# Patient Record
Sex: Male | Born: 1960 | Race: Black or African American | Hispanic: No | Marital: Single
Health system: Southern US, Community
[De-identification: ages and names within clinical notes are randomized; demographics above are authoritative.]

---

## 2007-06-11 ENCOUNTER — Ambulatory Visit (HOSPITAL_COMMUNITY): Payer: Self-pay | Admitting: Psychiatry

## 2007-06-21 ENCOUNTER — Ambulatory Visit (HOSPITAL_COMMUNITY): Payer: Self-pay | Admitting: Psychiatry

## 2018-01-01 DEATH — deceased

## 2019-08-03 ENCOUNTER — Encounter (HOSPITAL_COMMUNITY): Payer: Self-pay

## 2019-08-03 ENCOUNTER — Other Ambulatory Visit: Payer: Self-pay

## 2019-08-03 ENCOUNTER — Emergency Department (HOSPITAL_COMMUNITY): Payer: BLUE CROSS/BLUE SHIELD

## 2019-08-03 ENCOUNTER — Emergency Department (HOSPITAL_COMMUNITY)
Admission: EM | Admit: 2019-08-03 | Discharge: 2019-08-04 | Disposition: A | Discharge status: Home / Self Care | Payer: BLUE CROSS/BLUE SHIELD | Attending: Emergency Medicine | Admitting: Emergency Medicine

## 2019-08-03 DIAGNOSIS — R112 Nausea with vomiting, unspecified: Secondary | ICD-10-CM

## 2019-08-03 DIAGNOSIS — U071 COVID-19: Secondary | ICD-10-CM | POA: Diagnosis not present

## 2019-08-03 DIAGNOSIS — R5381 Other malaise: Secondary | ICD-10-CM

## 2019-08-03 MED ORDER — SODIUM CHLORIDE 0.9% FLUSH
3.0000 mL | Freq: Once | INTRAVENOUS | Status: AC
  Administered 2019-08-04: 3 mL via INTRAVENOUS

## 2019-08-03 MED ORDER — SODIUM CHLORIDE 0.9 % IV BOLUS
500.0000 mL | Freq: Once | INTRAVENOUS | Status: AC
  Administered 2019-08-04: 500 mL via INTRAVENOUS

## 2019-08-03 MED ORDER — ONDANSETRON HCL 4 MG/2ML IJ SOLN
4.0000 mg | Freq: Once | INTRAMUSCULAR | Status: AC
  Administered 2019-08-04: 4 mg via INTRAVENOUS
  Filled 2019-08-03: qty 2

## 2019-08-03 NOTE — ED Triage Notes (Signed)
Per EMS, patient c/o nausea/vomitting starting Monday 1/25. Patient states he tested positive for covid-19 on 1/26. Patient is taking doxycycline for covid and states he took Tylenol around 1300 today. Patient from home, is ambulatory and a&ox4.

## 2019-08-04 LAB — COMPREHENSIVE METABOLIC PANEL
ALT: 25 U/L (ref 0–44)
AST: 36 U/L (ref 15–41)
Albumin: 3.8 g/dL (ref 3.5–5.0)
Alkaline Phosphatase: 43 U/L (ref 38–126)
Anion gap: 9 (ref 5–15)
BUN: 21 mg/dL — ABNORMAL HIGH (ref 6–20)
CO2: 24 mmol/L (ref 22–32)
Calcium: 7.7 mg/dL — ABNORMAL LOW (ref 8.9–10.3)
Chloride: 104 mmol/L (ref 98–111)
Creatinine, Ser: 1.05 mg/dL (ref 0.61–1.24)
GFR calc Af Amer: >60 mL/min (ref >60)
GFR calc non Af Amer: >60 mL/min (ref >60)
Glucose, Bld: 93 mg/dL (ref 70–99)
Potassium: 3.9 mmol/L (ref 3.5–5.1)
Sodium: 137 mmol/L (ref 135–145)
Total Bilirubin: 1.4 mg/dL — ABNORMAL HIGH (ref 0.3–1.2)
Total Protein: 7.2 g/dL (ref 6.5–8.1)

## 2019-08-04 LAB — CBC
HCT: 48.7 % (ref 39.0–52.0)
Hemoglobin: 16.3 g/dL (ref 13.0–17.0)
MCH: 31.2 pg (ref 26.0–34.0)
MCHC: 33.5 g/dL (ref 30.0–36.0)
MCV: 93.3 fL (ref 80.0–100.0)
Platelets: 149 10*3/uL — ABNORMAL LOW (ref 150–400)
RBC: 5.22 MIL/uL (ref 4.22–5.81)
RDW: 12.8 % (ref 11.5–15.5)
WBC: 3.6 10*3/uL — ABNORMAL LOW (ref 4.0–10.5)
nRBC: 0 % (ref 0.0–0.2)

## 2019-08-04 LAB — LIPASE, BLOOD: Lipase: 45 U/L (ref 11–51)

## 2019-08-04 MED ORDER — ONDANSETRON 8 MG PO TBDP: 8.0000 mg | ORAL_TABLET | Freq: Three times a day (TID) | ORAL | 0 refills | Status: AC | PRN

## 2019-08-04 NOTE — ED Notes (Signed)
Patient tolerated fluid/po challenge well.

## 2019-08-04 NOTE — ED Notes (Signed)
Patient attempted to provide urine sample but stated he is unable to do so at this time. Urinal at bedside. This RN provided patient with crackers and water for PO/fluid challenge.

## 2019-08-04 NOTE — ED Provider Notes (Addendum)
[facility]-EMERGENCY DEPT Provider Note   CSN: [id number] Arrival date & time: 08/03/19  2149     History No chief complaint on file.   Shannon Bullock is a 59 y.o. male.  HPI     59 year old healthy male comes in a chief complaint of nausea, vomiting, fatigue and malaise.  He was diagnosed with COVID-19 4 days ago.  He has been symptomatic for the last 5 days.  He had a cough that has subsided.  He continues to have shortness of breath with exertion and malaise but lately has been having worsening nausea and vomiting.  He has not been able to tolerate much food and has no appetite.  He is also feeling weaker having some headaches.  Patient has no underlying lung disease, cardiac disease.  History reviewed. No pertinent past medical history.  There are no problems to display for this patient.   History reviewed. No pertinent surgical history.     No family history on file.  Social History   Tobacco Use  . Smoking status: Not on file  Substance Use Topics  . Alcohol use: Not on file  . Drug use: Not on file    Home Medications Prior to Admission medications   Medication Sig Start Date End Date Taking? Authorizing Provider  albuterol (VENTOLIN HFA) 108 (90 Base) MCG/ACT inhaler Inhale 1 puff into the lungs every 6 (six) hours as needed for wheezing or shortness of breath.  07/30/19 08/29/19 Yes [provider]  doxycycline (VIBRAMYCIN) 100 MG capsule Take 100 mg by mouth 2 (two) times daily. 07/30/19 08/09/19 Yes [provider]  ondansetron (ZOFRAN ODT) 8 MG disintegrating tablet Take 1 tablet (8 mg total) by mouth every 8 (eight) hours as needed for nausea. 08/04/19   Derwood Kaplan, MD    Allergies    Patient has no known allergies.  Review of Systems   Review of Systems  Constitutional: Positive for activity change.  Respiratory: Positive for shortness of breath. Negative for cough.   Cardiovascular: Negative for chest pain.   Gastrointestinal: Positive for nausea and vomiting. Negative for abdominal pain.  Allergic/Immunologic: Negative for immunocompromised state.  Hematological: Does not bruise/bleed easily.  All other systems reviewed and are negative.   Physical Exam Updated Vital Signs BP 125/89   Pulse 98   Temp 99.9 F (37.7 C) (Oral)   Resp (!) 21   SpO2 95%   Physical Exam Vitals and nursing note reviewed.  Constitutional:      Appearance: He is well-developed.  HENT:     Head: Atraumatic.     Mouth/Throat:     Mouth: Mucous membranes are dry.  Cardiovascular:     Rate and Rhythm: Normal rate.  Pulmonary:     Effort: Pulmonary effort is normal.  Abdominal:     Tenderness: There is no abdominal tenderness.  Musculoskeletal:     Cervical back: Neck supple.  Skin:    General: Skin is warm.  Neurological:     Mental Status: He is alert and oriented to person, place, and time.     ED Results / Procedures / Treatments   Labs (all labs ordered are listed, but only abnormal results are displayed) Labs Reviewed  COMPREHENSIVE METABOLIC PANEL - Abnormal; Notable for the following components:      Result Value   BUN 21 (*)    Calcium 7.8 (*)    Total Bilirubin 1.5 (*)    All other components within normal limits  CBC - Abnormal; Notable for the following components:   WBC 3.6 (*)    Platelets 149 (*)    All other components within normal limits  LIPASE, BLOOD  URINALYSIS, ROUTINE W REFLEX MICROSCOPIC    EKG None  Radiology DG Chest Port 1 View  Result Date: 08/04/2019 CLINICAL DATA:  Nausea and vomiting. EXAM: PORTABLE CHEST 1 VIEW COMPARISON:  None. FINDINGS: Very mild atelectasis and/or early infiltrate is seen within the left lung base. There is no evidence of a pleural effusion or pneumothorax. The heart size and mediastinal contours are within normal limits. Multilevel degenerative changes seen throughout the thoracic spine. IMPRESSION: 1. Very mild left basilar  atelectasis and/or early infiltrate. Electronically Signed   By: Virgina Norfolk M.D.   On: 08/04/2019 00:00    Procedures Procedures (including critical care time)  Medications Ordered in ED Medications  sodium chloride flush (NS) 0.9 % injection 3 mL (3 mLs Intravenous Given 08/04/19 0042)  ondansetron (ZOFRAN) injection 4 mg (4 mg Intravenous Given 08/04/19 0029)  sodium chloride 0.9 % bolus 500 mL (0 mLs Intravenous Stopped 08/04/19 0217)    ED Course  I have reviewed the triage vital signs and the nursing notes.  Pertinent labs & imaging results that were available during my care of the patient were reviewed by me and considered in my medical decision making (see chart for details).  Clinical Course as of Aug 03 224  Nancy Fetter Aug 04, 2019  0226 Labs are reassuring. Pt reassessed. Pt's VSS and WNL. Pt's cap refill < 3 seconds. Pt has been hydrated in the ER and now passed po challenge. We will discharge with antiemetic. Strict ER return precautions have been discussed and pt will return if he is unable to tolerate fluids and symptoms are getting worse.    [AN]    Clinical Course User Index [AN] Varney Biles, MD   MDM Rules/Calculators/A&P                      Shannon Bullock was evaluated in Emergency Department on 08/04/2019 for the symptoms described in the history of present illness. He was evaluated in the context of the global COVID-19 pandemic, which necessitated consideration that the patient might be at risk for infection with the SARS-CoV-2 virus that causes COVID-19. Institutional protocols and algorithms that pertain to the evaluation of patients at risk for COVID-19 are in a state of rapid change based on information released by regulatory bodies including the CDC and federal and state organizations. These policies and algorithms were followed during the patient's care in the ED.   59 year old healthy male comes in a chief complaint of nausea, vomiting and worsening  weakness.  He has been diagnosed with COVID-19.  He does not have any primary respiratory issues.  On exam he is appearing dry. He is not hypoxic and he is hemodynamically stable.  I suspect he will be discharged to the hospital we will ensure that there is no renal failure or significant electrolyte abnormalities.  We will start oral challenge.  Final Clinical Impression(s) / ED Diagnoses Final diagnoses:  COVID-19  Non-intractable vomiting with nausea, unspecified vomiting type  Malaise and fatigue    Rx / DC Orders ED Discharge Orders         Ordered    ondansetron (ZOFRAN ODT) 8 MG disintegrating tablet  Every 8 hours PRN     08/04/19 0225           Mikaylee Arseneau,  Delisia Mcquiston, MD 08/04/19 5465    Derwood Kaplan, MD 08/04/19 0354

## 2019-08-04 NOTE — Discharge Instructions (Signed)
Fortunately, your blood work is reassuring.  We did not see any signs of organ damage, and your electrolytes are normal.  Take the nausea medicine as prescribed. Ensure you hydrate well.

## 2020-02-25 ENCOUNTER — Other Ambulatory Visit: Payer: Self-pay

## 2020-02-25 ENCOUNTER — Emergency Department (HOSPITAL_COMMUNITY)
Admission: EM | Admit: 2020-02-25 | Discharge: 2020-02-25 | Disposition: A | Discharge status: Home / Self Care | Payer: BC Managed Care – PPO | Attending: Emergency Medicine | Admitting: Emergency Medicine

## 2020-02-25 ENCOUNTER — Encounter (HOSPITAL_COMMUNITY): Payer: Self-pay

## 2020-02-25 DIAGNOSIS — R519 Headache, unspecified: Secondary | ICD-10-CM | POA: Insufficient documentation

## 2020-02-25 DIAGNOSIS — N12 Tubulo-interstitial nephritis, not specified as acute or chronic: Secondary | ICD-10-CM | POA: Diagnosis not present

## 2020-02-25 DIAGNOSIS — R509 Fever, unspecified: Secondary | ICD-10-CM | POA: Diagnosis present

## 2020-02-25 DIAGNOSIS — Z20822 Contact with and (suspected) exposure to covid-19: Secondary | ICD-10-CM | POA: Insufficient documentation

## 2020-02-25 DIAGNOSIS — R42 Dizziness and giddiness: Secondary | ICD-10-CM | POA: Diagnosis not present

## 2020-02-25 LAB — BASIC METABOLIC PANEL
Anion gap: 10 (ref 5–15)
BUN: 12 mg/dL (ref 6–20)
CO2: 22 mmol/L (ref 22–32)
Calcium: 8.8 mg/dL — ABNORMAL LOW (ref 8.9–10.3)
Chloride: 102 mmol/L (ref 98–111)
Creatinine, Ser: 1 mg/dL (ref 0.61–1.24)
GFR calc Af Amer: >60 mL/min (ref >60)
GFR calc non Af Amer: >60 mL/min (ref >60)
Glucose, Bld: 107 mg/dL — ABNORMAL HIGH (ref 70–99)
Potassium: 3.4 mmol/L — ABNORMAL LOW (ref 3.5–5.1)
Sodium: 134 mmol/L — ABNORMAL LOW (ref 135–145)

## 2020-02-25 LAB — CBC WITH DIFFERENTIAL/PLATELET
Abs Immature Granulocytes: 0.06 10*3/uL (ref 0.00–0.07)
Basophils Absolute: 0 10*3/uL (ref 0.0–0.1)
Basophils Relative: 0 %
Eosinophils Absolute: 0 10*3/uL (ref 0.0–0.5)
Eosinophils Relative: 0 %
HCT: 48.2 % (ref 39.0–52.0)
Hemoglobin: 16.4 g/dL (ref 13.0–17.0)
Immature Granulocytes: 0 %
Lymphocytes Relative: 11 %
Lymphs Abs: 1.7 10*3/uL (ref 0.7–4.0)
MCH: 31.7 pg (ref 26.0–34.0)
MCHC: 34 g/dL (ref 30.0–36.0)
MCV: 93.2 fL (ref 80.0–100.0)
Monocytes Absolute: 1.1 10*3/uL — ABNORMAL HIGH (ref 0.1–1.0)
Monocytes Relative: 7 %
Neutro Abs: 12.2 10*3/uL — ABNORMAL HIGH (ref 1.7–7.7)
Neutrophils Relative %: 82 %
Platelets: 176 10*3/uL (ref 150–400)
RBC: 5.17 MIL/uL (ref 4.22–5.81)
RDW: 13.2 % (ref 11.5–15.5)
WBC: 15 10*3/uL — ABNORMAL HIGH (ref 4.0–10.5)
nRBC: 0 % (ref 0.0–0.2)

## 2020-02-25 LAB — URINALYSIS, ROUTINE W REFLEX MICROSCOPIC
Bilirubin Urine: NEGATIVE
Glucose, UA: NEGATIVE mg/dL
Ketones, ur: 20 mg/dL — AB
Nitrite: NEGATIVE
Protein, ur: 30 mg/dL — AB
Specific Gravity, Urine: 1.024 (ref 1.005–1.030)
WBC, UA: >50 WBC/hpf — ABNORMAL HIGH (ref 0–5)
pH: 5 (ref 5.0–8.0)

## 2020-02-25 LAB — LACTIC ACID, PLASMA: Lactic Acid, Venous: 1.1 mmol/L (ref 0.5–1.9)

## 2020-02-25 LAB — SARS CORONAVIRUS 2 BY RT PCR (HOSPITAL ORDER, PERFORMED IN ~~LOC~~ HOSPITAL LAB): SARS Coronavirus 2: NEGATIVE

## 2020-02-25 MED ORDER — ACETAMINOPHEN 325 MG PO TABS
650.0000 mg | ORAL_TABLET | Freq: Once | ORAL | Status: DC | PRN
  Filled 2020-02-25: qty 2

## 2020-02-25 MED ORDER — CIPROFLOXACIN HCL 500 MG PO TABS: 500.0000 mg | ORAL_TABLET | Freq: Two times a day (BID) | ORAL | 0 refills | Status: AC

## 2020-02-25 MED ORDER — ACETAMINOPHEN 500 MG PO TABS
1000.0000 mg | ORAL_TABLET | Freq: Once | ORAL | Status: AC
  Administered 2020-02-25: 1000 mg via ORAL
  Filled 2020-02-25: qty 2

## 2020-02-25 MED ORDER — SODIUM CHLORIDE 0.9 % IV SOLN
1.0000 g | Freq: Once | INTRAVENOUS | Status: AC
  Administered 2020-02-25: 1 g via INTRAVENOUS
  Filled 2020-02-25: qty 10

## 2020-02-25 NOTE — ED Triage Notes (Addendum)
Pt presents with c/o fever and headache. Pt reports that his symptoms started Sunday night. Pt reports he had Covid earlier this year.

## 2020-02-25 NOTE — Discharge Instructions (Signed)
Please read and follow all provided instructions.  Your diagnoses today include:  1. Pyelonephritis    Tests performed today include:  Blood counts and electrolytes - high infection fighting cells  Kidney function - normal  Urine test - shows sign of infection  Blood and urine culture - pending  Vital signs. See below for your results today.   Medications prescribed:   Ciprofloxacin - antibiotic  You have been prescribed an antibiotic medicine: take the entire course of medicine even if you are feeling better. Stopping early can cause the antibiotic not to work.  Take any prescribed medications only as directed.  Home care instructions:  Follow any educational materials contained in this packet.  BE VERY CAREFUL not to take multiple medicines containing Tylenol (also called acetaminophen). Doing so can lead to an overdose which can damage your liver and cause liver failure and possibly death.   Follow-up instructions: Please follow-up with your primary care provider in the next 3 days for further evaluation of your symptoms.   Return instructions:   Please return to the Emergency Department if you experience worsening symptoms.   Please return with worsening pain, persistent vomiting  Please return if you have any other emergent concerns.  Additional Information:  Your vital signs today were: BP (!) 171/105    Pulse 98    Temp (!) 102.2 F (39 C) (Oral)    Resp 16    Ht 6\' 1"  (1.854 m)    Wt 88.5 kg    SpO2 97%    BMI 25.73 kg/m  If your blood pressure (BP) was elevated above 135/85 this visit, please have this repeated by your doctor within one month. --------------

## 2020-02-25 NOTE — ED Notes (Signed)
Attempted to call pt to give him some Tylenol for his fever. Pt not seen in lobby or surrounding areas. Will check back soon.

## 2020-02-25 NOTE — ED Provider Notes (Signed)
[facility]-EMERGENCY DEPT Provider Note   CSN: [id number] Arrival date & time: 02/25/20  1154     History Chief Complaint  Patient presents with  . Fever  . Headache    Shannon Bullock is a 59 y.o. male.  Patient with history of coronavirus infection in January 2021, vaccine 4/21 -- presents to the ED on day 3 of illness with fever, headache, malaise, lightheadedness, body aches.  Fever to 102.2 F here.  Patient denies nausea, vomiting diarrhea.  He can taste and smell.  He had a close contact recently with a roommate who was just cleared with coronavirus infection.  Patient reports mild burning with urination.  No skin rashes or tick bites.  No neck pain or confusion.        History reviewed. No pertinent past medical history.  There are no problems to display for this patient.   History reviewed. No pertinent surgical history.     History reviewed. No pertinent family history.  Social History   Tobacco Use  . Smoking status: Not on file  Substance Use Topics  . Alcohol use: Not on file  . Drug use: Not on file    Home Medications Prior to Admission medications   Medication Sig Start Date End Date Taking? Authorizing Provider  albuterol (VENTOLIN HFA) 108 (90 Base) MCG/ACT inhaler Inhale 1 puff into the lungs every 6 (six) hours as needed for wheezing or shortness of breath.  07/30/19 08/29/19  [provider]  ondansetron (ZOFRAN ODT) 8 MG disintegrating tablet Take 1 tablet (8 mg total) by mouth every 8 (eight) hours as needed for nausea. 08/04/19   Derwood Kaplan, MD    Allergies    Patient has no known allergies.  Review of Systems   Review of Systems  Constitutional: Positive for chills, fatigue and fever.  HENT: Negative for rhinorrhea and sore throat.   Eyes: Negative for redness.  Respiratory: Negative for cough and shortness of breath.   Cardiovascular: Negative for chest pain.  Gastrointestinal: Positive for nausea.  Negative for abdominal pain, diarrhea and vomiting.  Genitourinary: Positive for dysuria. Negative for hematuria.  Musculoskeletal: Positive for back pain (chronic). Negative for myalgias.  Skin: Negative for rash.  Neurological: Positive for light-headedness and headaches.    Physical Exam Updated Vital Signs BP (!) 146/104 (BP Location: Left Arm)   Pulse (!) 106   Temp (!) 102.2 F (39 C) (Oral)   Resp 18   Ht 6\' 1"  (1.854 m)   Wt 88.5 kg   SpO2 97%   BMI 25.73 kg/m   Physical Exam Vitals and nursing note reviewed.  Constitutional:      Appearance: He is well-developed.  HENT:     Head: Normocephalic and atraumatic.     Mouth/Throat:     Mouth: Mucous membranes are moist.  Eyes:     General:        Right eye: No discharge.        Left eye: No discharge.     Conjunctiva/sclera: Conjunctivae normal.  Neck:     Comments: Full range of motion of neck without guarding.  No meningeal signs. Cardiovascular:     Rate and Rhythm: Regular rhythm. Tachycardia present.     Heart sounds: Normal heart sounds.     Comments: Mild tachycardia Pulmonary:     Effort: Pulmonary effort is normal.     Breath sounds: Normal breath sounds.  Abdominal:     Palpations: Abdomen is soft.  Tenderness: There is no abdominal tenderness.  Musculoskeletal:     Cervical back: Normal range of motion and neck supple.  Skin:    General: Skin is warm and dry.  Neurological:     Mental Status: He is alert.     ED Results / Procedures / Treatments   Labs (all labs ordered are listed, but only abnormal results are displayed) Labs Reviewed  URINALYSIS, ROUTINE W REFLEX MICROSCOPIC - Abnormal; Notable for the following components:      Result Value   APPearance CLOUDY (*)    Hgb urine dipstick LARGE (*)    Ketones, ur 20 (*)    Protein, ur 30 (*)    Leukocytes,Ua LARGE (*)    WBC, UA >50 (*)    Bacteria, UA FEW (*)    Non Squamous Epithelial 0-5 (*)    All other components within normal  limits  CBC WITH DIFFERENTIAL/PLATELET - Abnormal; Notable for the following components:   WBC 15.0 (*)    Neutro Abs 12.2 (*)    Monocytes Absolute 1.1 (*)    All other components within normal limits  BASIC METABOLIC PANEL - Abnormal; Notable for the following components:   Sodium 134 (*)    Potassium 3.4 (*)    Glucose, Bld 107 (*)    Calcium 8.8 (*)    All other components within normal limits  SARS CORONAVIRUS 2 BY RT PCR (HOSPITAL ORDER, PERFORMED IN [facility])  CULTURE, BLOOD (ROUTINE X 2)  CULTURE, BLOOD (ROUTINE X 2)  URINE CULTURE  LACTIC ACID, PLASMA    EKG None  Radiology No results found.  Procedures Procedures (including critical care time)  Medications Ordered in ED Medications  acetaminophen (TYLENOL) tablet 1,000 mg (1,000 mg Oral Given 02/25/20 1637)  cefTRIAXone (ROCEPHIN) 1 g in sodium chloride 0.9 % 100 mL IVPB (0 g Intravenous Stopped 02/25/20 2010)    ED Course  I have reviewed the triage vital signs and the nursing notes.  Pertinent labs & imaging results that were available during my care of the patient were reviewed by me and considered in my medical decision making (see chart for details).  Patient seen and examined.  Patient looks very well.  No signs of meningitis.  Awaiting administration of Tylenol for fever.  Will check Covid test given concern for breakthrough infection given close contact.  Will also check urine test given reported dysuria, however no other symptoms of UTI at this time.  Vital signs reviewed and are as follows: BP (!) 146/104 (BP Location: Left Arm)   Pulse (!) 106   Temp (!) 102.2 F (39 C) (Oral)   Resp 18   Ht 6\' 1"  (1.854 m)   Wt 88.5 kg   SpO2 97%   BMI 25.73 kg/m   5:36 PM UA + infection -- making pyelo more likely. Pt updated. Continues to look well. Given complicated UTI -- will check labs, send urine/blood cultures. If reassuring and tolerating PO -- may be able to go home.   8:12 PM  patient has done well here.  He is received Rocephin IV.  Lactate is normal.  White blood cell count is elevated as expected.  No vomiting.  Plan for discharge to home on p.o. Cipro.  Encourage PCP follow-up in the next 2 to 3 days for recheck.  He states that he has a doctor in .  I have encouraged return to the emergency department with persistent vomiting, worsening pain, new symptoms or  other concerns.     MDM Rules/Calculators/A&P                          Patient with complicated UTI, likely pyelonephritis.  Covid test was negative.  Patient with elevated white blood cell count.  Normal lactate.  Blood and urine cultures have been sent.  Patient was given a dose of IV Rocephin in the emergency department.  Clinically he appears well without uncontrolled symptoms such as vomiting or pain.  After discussion with patient, he is comfortable discharged home with oral antibiotics.  Strict return instructions as above.  Strongly encouraged PCP follow-up for recheck in the next several days.   Final Clinical Impression(s) / ED Diagnoses Final diagnoses:  Pyelonephritis    Rx / DC Orders ED Discharge Orders         Ordered    ciprofloxacin (CIPRO) 500 MG tablet  2 times daily        02/25/20 2006           Renne Crigler, Cordelia Poche 02/25/20 2013    Little, Ambrose Finland, MD 02/26/20 [id number]

## 2020-02-29 LAB — URINE CULTURE: Culture: 80000 — AB

## 2020-03-01 ENCOUNTER — Telehealth: Payer: Self-pay | Admitting: Emergency Medicine

## 2020-03-01 LAB — CULTURE, BLOOD (ROUTINE X 2)
Culture: NO GROWTH
Culture: NO GROWTH
Special Requests: ADEQUATE
Special Requests: ADEQUATE

## 2020-03-01 NOTE — Telephone Encounter (Signed)
Post ED Visit - Positive Culture Follow-up  Culture report reviewed by antimicrobial stewardship pharmacist: [facility] []  , Pharm.D. []  Enzo Bi, Pharm.D., BCPS AQ-ID []  , Pharm.D., BCPS []  Celedonio Miyamoto, Pharm.D., BCPS []  [place], Garvin Fila.D., BCPS, AAHIVP []  , Pharm.D., BCPS, AAHIVP []  Georgina Pillion, PharmD, BCPS []  , PharmD, BCPS []  [place], PharmD, BCPS []  [place], PharmD []  , PharmD, BCPS []  Estella Husk, PharmD  Pharmacy Team []  Lysle Pearl, PharmD []  , PharmD []  Phillips Climes, PharmD [x]  , Rph []  Agapito Games) , PharmD []  Verlan Friends, PharmD []  , PharmD []  Mervyn Gay, PharmD []  , PharmD []  Vinnie Level, PharmD []  Wonda Olds, PharmD []  , PharmD []  Len Childs, PharmD   Positive urine culture Treated with Ciprofloxacin, organism sensitive to the same. Symptom check, patient states he is improving, and will follow-up with PCP this week.  Noelia Lenart 03/01/2020, 4:24 PM

## 2020-03-01 NOTE — Progress Notes (Signed)
ED Antimicrobial Stewardship Positive Culture Follow Up   Shannon Bullock is an 59 y.o. male who presented to [facility] on 02/25/2020 with a chief complaint of  Chief Complaint  Patient presents with  . Fever  . Headache    Recent Results (from the past 720 hour(s))  SARS Coronavirus 2 by RT PCR (hospital order, performed in [facility] hospital lab) Nasopharyngeal Nasopharyngeal Swab     Status: None   Collection Time: 02/25/20  4:38 PM   Specimen: Nasopharyngeal Swab  Result Value Ref Range Status   SARS Coronavirus 2 NEGATIVE NEGATIVE Final    Comment: (NOTE) SARS-CoV-2 target nucleic acids are NOT DETECTED.  The SARS-CoV-2 RNA is generally detectable in upper and lower respiratory specimens during the acute phase of infection. The lowest concentration of SARS-CoV-2 viral copies this assay can detect is 250 copies / mL. A negative result does not preclude SARS-CoV-2 infection and should not be used as the sole basis for treatment or other patient management decisions.  A negative result may occur with improper specimen collection / handling, submission of specimen other than nasopharyngeal swab, presence of viral mutation(s) within the areas targeted by this assay, and inadequate number of viral copies (<250 copies / mL). A negative result must be combined with clinical observations, patient history, and epidemiological information.  Fact Sheet for Patients:   BoilerBrush.com.cy  Fact Sheet for Healthcare Providers: https://pope.com/  This test is not yet approved or  cleared by the [place] FDA and has been authorized for detection and/or diagnosis of SARS-CoV-2 by FDA under an Emergency Use Authorization (EUA).  This EUA will remain in effect (meaning this test can be used) for the duration of the COVID-19 declaration under Section 564(b)(1) of the Act, 21 U.S.C. section 360bbb-3(b)(1), unless the authorization is  terminated or revoked sooner.  Performed at [facility], [address], [place], [place] [address]   Urine Culture     Status: Abnormal   Collection Time: 02/25/20  4:46 PM   Specimen: Urine, Clean Catch  Result Value Ref Range Status   Specimen Description   Final    URINE, CLEAN CATCH Performed at [facility], [address] [place], [place] [address]    Special Requests   Final    NONE Performed at [facility], [address] [place], [place] [address]    Culture (A)  Final    80,000 COLONIES/mL ESCHERICHIA COLI Confirmed Extended Spectrum Beta-Lactamase Producer (ESBL).  In bloodstream infections from ESBL organisms, carbapenems are preferred over piperacillin/tazobactam. They are shown to have a lower risk of mortality.    Report Status 02/29/2020 FINAL  Final   Organism ID, Bacteria ESCHERICHIA COLI (A)  Final      Susceptibility   Escherichia coli - MIC*    AMPICILLIN >=32 RESISTANT Resistant     CEFAZOLIN RESISTANT Resistant     CEFTRIAXONE 0.5 SENSITIVE Sensitive     CIPROFLOXACIN <=0.25 SENSITIVE Sensitive     GENTAMICIN <=1 SENSITIVE Sensitive     IMIPENEM <=0.25 SENSITIVE Sensitive     NITROFURANTOIN <=16 SENSITIVE Sensitive     TRIMETH/SULFA <=20 SENSITIVE Sensitive     AMPICILLIN/SULBACTAM >=32 RESISTANT Resistant     PIP/TAZO <=4 SENSITIVE Sensitive     * 80,000 COLONIES/mL ESCHERICHIA COLI  Blood culture (routine x 2)     Status: None (Preliminary result)   Collection Time: 02/25/20  5:33 PM   Specimen: BLOOD  Result Value Ref  Range Status   Specimen Description   Final    BLOOD LEFT ANTECUBITAL Performed at [facility], [address] [place], [place] [address]    Special Requests   Final    BOTTLES DRAWN AEROBIC AND ANAEROBIC Blood Culture adequate volume Performed at [facility], [address], [place], [place] [address]    Culture   Final    NO  GROWTH 4 DAYS Performed at [facility], [address] [place], [place] [address]    Report Status PENDING  Incomplete  Blood culture (routine x 2)     Status: None (Preliminary result)   Collection Time: 02/25/20  5:38 PM   Specimen: BLOOD  Result Value Ref Range Status   Specimen Description   Final    BLOOD RIGHT WRIST Performed at [facility], [address] [place], [place] [address]    Special Requests   Final    BOTTLES DRAWN AEROBIC AND ANAEROBIC Blood Culture adequate volume Performed at [facility], [address] [place], [place] [address]    Culture   Final    NO GROWTH 4 DAYS Performed at [facility], [address], [place], [place] [address]    Report Status PENDING  Incomplete    [x]  Treated with Ciprofloxacin, variable susceptibility to prescribed antimicrobial []  Patient discharged originally without antimicrobial agent and treatment is now indicated  Request symptom check, if improving continue Ciprofloxacin.  If not improving, come back for recheck.  Also ask patient to follow up with PCP.  ED Provider: , PA  Thank you,   03/01/2020, 10:29 AM Clinical Pharmacist [phone]

## 2020-10-20 IMAGING — DX DG CHEST 1V PORT
1 series · 1 of 1 positions shown · non-contrast
Comparison: None.

CLINICAL DATA: Nausea and vomiting.

EXAM:
PORTABLE CHEST 1 VIEW

[chest ap]
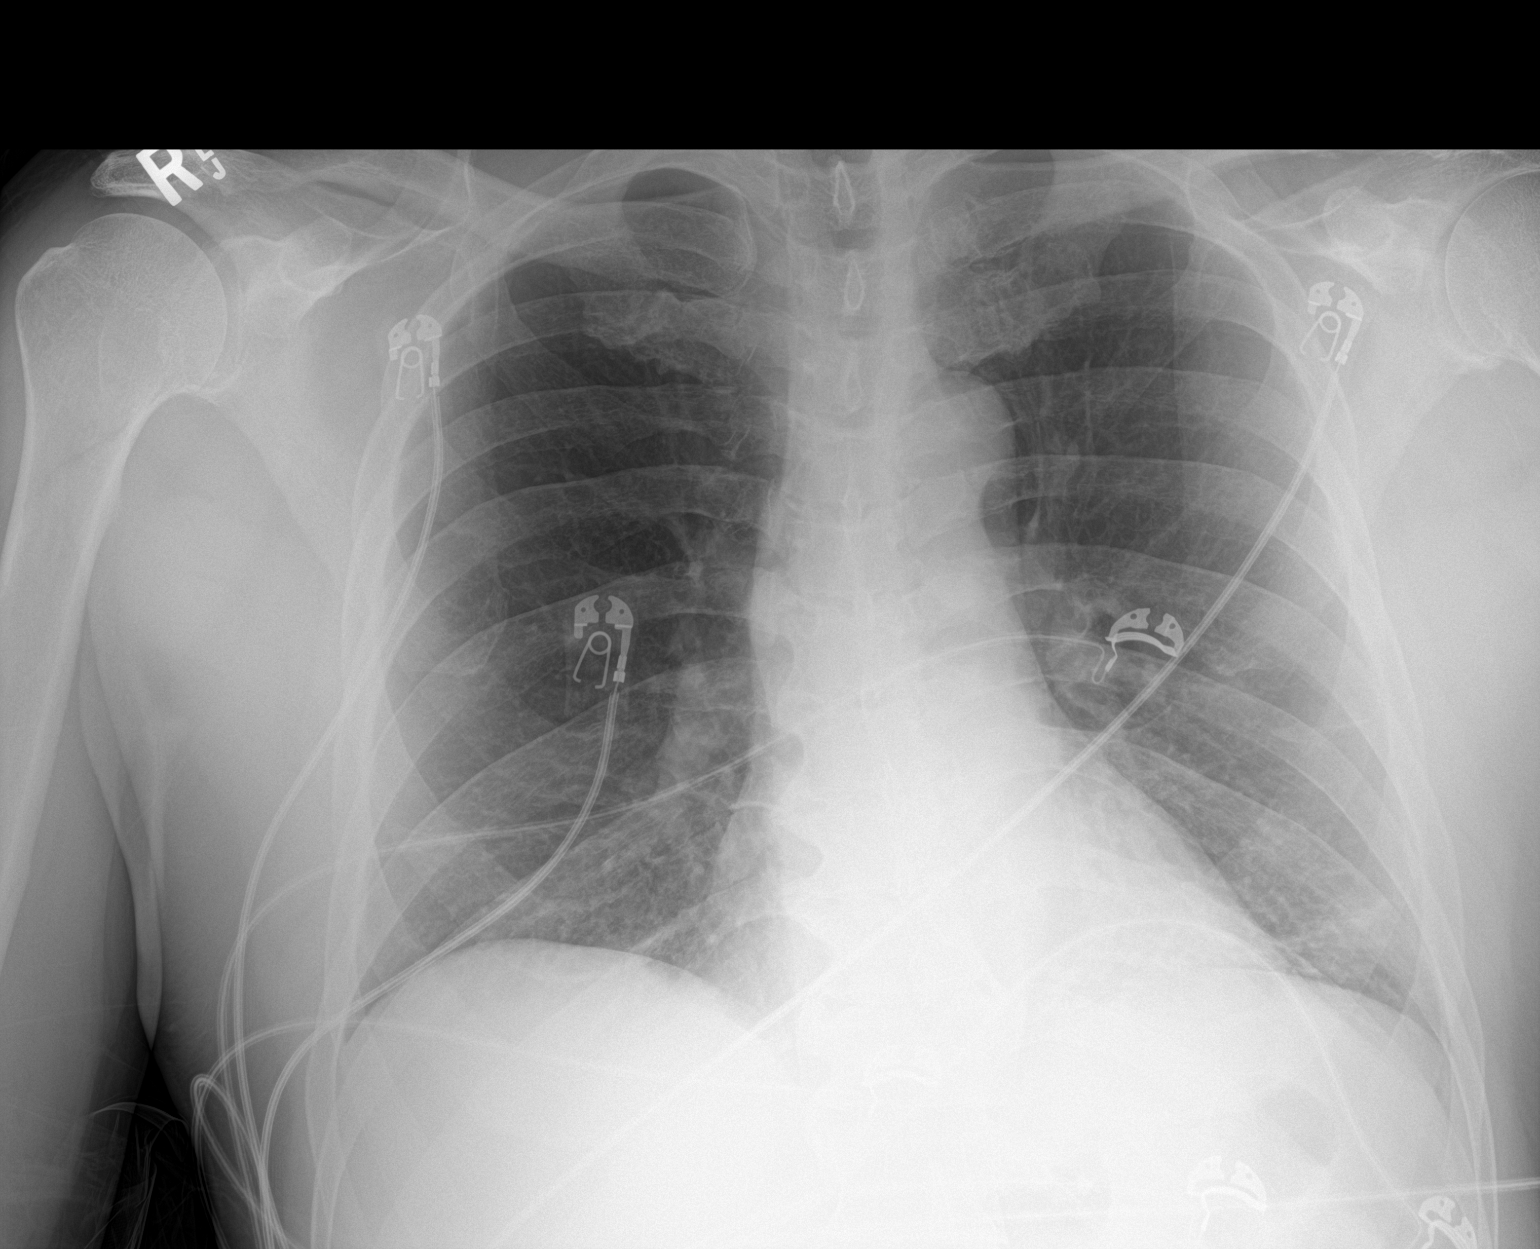

[1 of 1 positions shown; findings below may reference images not displayed]

FINDINGS: Very mild atelectasis and/or early infiltrate is seen within the
left lung base. There is no evidence of a pleural effusion or
pneumothorax. The heart size and mediastinal contours are within
normal limits. Multilevel degenerative changes seen throughout the
thoracic spine.
IMPRESSION: 1. Very mild left basilar atelectasis and/or early infiltrate.
